# Patient Record
Sex: Male | Born: 1975 | Race: White | Hispanic: Yes
Health system: Southern US, Community
[De-identification: ages and names within clinical notes are randomized; demographics above are authoritative.]

---

## 2016-02-04 ENCOUNTER — Inpatient Hospital Stay (HOSPITAL_COMMUNITY)
Admission: EM | Admit: 2016-02-04 | Discharge: 2016-02-25 | DRG: 083 | Disposition: E | Payer: Worker's Compensation | Attending: General Surgery | Admitting: General Surgery

## 2016-02-04 ENCOUNTER — Emergency Department (HOSPITAL_COMMUNITY): Payer: Worker's Compensation

## 2016-02-04 ENCOUNTER — Inpatient Hospital Stay (HOSPITAL_COMMUNITY): Payer: Worker's Compensation

## 2016-02-04 DIAGNOSIS — R579 Shock, unspecified: Secondary | ICD-10-CM | POA: Diagnosis present

## 2016-02-04 DIAGNOSIS — R402312 Coma scale, best motor response, none, at arrival to emergency department: Secondary | ICD-10-CM | POA: Diagnosis present

## 2016-02-04 DIAGNOSIS — W11XXXA Fall on and from ladder, initial encounter: Secondary | ICD-10-CM | POA: Diagnosis present

## 2016-02-04 DIAGNOSIS — Y92512 Supermarket, store or market as the place of occurrence of the external cause: Secondary | ICD-10-CM

## 2016-02-04 DIAGNOSIS — I469 Cardiac arrest, cause unspecified: Secondary | ICD-10-CM | POA: Diagnosis present

## 2016-02-04 DIAGNOSIS — G9382 Brain death: Secondary | ICD-10-CM | POA: Diagnosis present

## 2016-02-04 DIAGNOSIS — W19XXXA Unspecified fall, initial encounter: Secondary | ICD-10-CM | POA: Diagnosis present

## 2016-02-04 DIAGNOSIS — R040 Epistaxis: Secondary | ICD-10-CM | POA: Diagnosis present

## 2016-02-04 DIAGNOSIS — R402212 Coma scale, best verbal response, none, at arrival to emergency department: Secondary | ICD-10-CM | POA: Diagnosis present

## 2016-02-04 DIAGNOSIS — S0219XA Other fracture of base of skull, initial encounter for closed fracture: Secondary | ICD-10-CM | POA: Diagnosis present

## 2016-02-04 DIAGNOSIS — S0232XA Fracture of orbital floor, left side, initial encounter for closed fracture: Secondary | ICD-10-CM | POA: Diagnosis present

## 2016-02-04 DIAGNOSIS — R402112 Coma scale, eyes open, never, at arrival to emergency department: Secondary | ICD-10-CM | POA: Diagnosis present

## 2016-02-04 DIAGNOSIS — S020XXA Fracture of vault of skull, initial encounter for closed fracture: Secondary | ICD-10-CM | POA: Diagnosis present

## 2016-02-04 DIAGNOSIS — R401 Stupor: Secondary | ICD-10-CM

## 2016-02-04 LAB — COMPREHENSIVE METABOLIC PANEL
ALBUMIN: 3.4 g/dL — AB (ref 3.5–5.0)
ALK PHOS: 114 U/L (ref 38–126)
ALT: 38 U/L (ref 17–63)
ANION GAP: 16 — AB (ref 5–15)
AST: 59 U/L — ABNORMAL HIGH (ref 15–41)
BUN: 11 mg/dL (ref 6–20)
CHLORIDE: 104 mmol/L (ref 101–111)
CO2: 19 mmol/L — AB (ref 22–32)
Calcium: 8.6 mg/dL — ABNORMAL LOW (ref 8.9–10.3)
Creatinine, Ser: 1.41 mg/dL — ABNORMAL HIGH (ref 0.61–1.24)
GFR calc non Af Amer: 29 mL/min — ABNORMAL LOW (ref >60)
GFR, EST AFRICAN AMERICAN: 34 mL/min — AB (ref >60)
GLUCOSE: 322 mg/dL — AB (ref 65–99)
Potassium: 3.4 mmol/L — ABNORMAL LOW (ref 3.5–5.1)
SODIUM: 139 mmol/L (ref 135–145)
Total Bilirubin: 0.6 mg/dL (ref 0.3–1.2)
Total Protein: 6.1 g/dL — ABNORMAL LOW (ref 6.5–8.1)

## 2016-02-04 LAB — I-STAT CHEM 8, ED
BUN: 12 mg/dL (ref 6–20)
CALCIUM ION: 1.09 mmol/L — AB (ref 1.13–1.30)
CHLORIDE: 102 mmol/L (ref 101–111)
Creatinine, Ser: 1.3 mg/dL — ABNORMAL HIGH (ref 0.61–1.24)
GLUCOSE: 309 mg/dL — AB (ref 65–99)
HCT: 35 % — ABNORMAL LOW (ref 39.0–52.0)
HEMOGLOBIN: 11.9 g/dL — AB (ref 13.0–17.0)
POTASSIUM: 3.4 mmol/L — AB (ref 3.5–5.1)
SODIUM: 141 mmol/L (ref 135–145)
TCO2: 20 mmol/L (ref 0–100)

## 2016-02-04 LAB — I-STAT ARTERIAL BLOOD GAS, ED
Acid-base deficit: 19 mmol/L — ABNORMAL HIGH (ref 0.0–2.0)
Bicarbonate: 12.9 mEq/L — ABNORMAL LOW (ref 20.0–24.0)
O2 SAT: 79 %
PCO2 ART: 62.3 mmHg — AB (ref 35.0–45.0)
PH ART: 6.924 — AB (ref 7.350–7.450)
PO2 ART: 71 mmHg — AB (ref 80.0–100.0)
Patient temperature: 98.6
TCO2: 15 mmol/L (ref 0–100)

## 2016-02-04 LAB — PREPARE RBC (CROSSMATCH)

## 2016-02-04 LAB — CBC
HEMATOCRIT: 36.9 % — AB (ref 39.0–52.0)
HEMOGLOBIN: 11.6 g/dL — AB (ref 13.0–17.0)
MCH: 27.2 pg (ref 26.0–34.0)
MCHC: 31.4 g/dL (ref 30.0–36.0)
MCV: 86.4 fL (ref 78.0–100.0)
Platelets: 247 10*3/uL (ref 150–400)
RBC: 4.27 MIL/uL (ref 4.22–5.81)
RDW: 12.7 % (ref 11.5–15.5)
WBC: 9.7 10*3/uL (ref 4.0–10.5)

## 2016-02-04 LAB — I-STAT CG4 LACTIC ACID, ED: Lactic Acid, Venous: 9.1 mmol/L (ref 0.5–1.9)

## 2016-02-04 LAB — ABO/RH: ABO/RH(D): O POS

## 2016-02-04 LAB — PROTIME-INR
INR: 1.25
Prothrombin Time: 15.8 seconds — ABNORMAL HIGH (ref 11.4–15.2)

## 2016-02-04 LAB — CDS SEROLOGY

## 2016-02-04 MED ORDER — SODIUM CHLORIDE 0.9 % IV SOLN
INTRAVENOUS | Status: AC | PRN
  Administered 2016-02-04: 2000 mL via INTRAVENOUS

## 2016-02-04 MED ORDER — ETOMIDATE 2 MG/ML IV SOLN
INTRAVENOUS | Status: AC | PRN
  Administered 2016-02-04: 10 mg via INTRAVENOUS

## 2016-02-04 MED ORDER — SODIUM CHLORIDE 0.9 % IV SOLN: 10.0000 mL/h | Freq: Once | INTRAVENOUS | Status: DC

## 2016-02-04 MED ORDER — EPINEPHRINE HCL 0.1 MG/ML IJ SOSY
PREFILLED_SYRINGE | INTRAMUSCULAR | Status: AC | PRN
  Administered 2016-02-04 (×5): 1 mg via INTRAVENOUS
  Administered 2016-02-04: 0.1 mg via INTRAVENOUS

## 2016-02-04 MED ORDER — NOREPINEPHRINE BITARTRATE 1 MG/ML IV SOLN
0.0000 ug/min | Freq: Once | INTRAVENOUS | Status: AC
  Administered 2016-02-04: 2 ug/min via INTRAVENOUS

## 2016-02-04 MED ORDER — ATROPINE SULFATE 1 MG/ML IJ SOLN
INTRAMUSCULAR | Status: AC | PRN
  Administered 2016-02-04: 1 mg via INTRAVENOUS

## 2016-02-04 MED ORDER — IOPAMIDOL (ISOVUE-300) INJECTION 61%
INTRAVENOUS | Status: AC
  Filled 2016-02-04: qty 100

## 2016-02-04 MED FILL — Medication: Qty: 1 | Status: AC

## 2016-02-05 LAB — PREPARE FRESH FROZEN PLASMA
UNIT DIVISION: 0
UNIT DIVISION: 0

## 2016-02-05 NOTE — Progress Notes (Signed)
   02/05/16 1706  Clinical Encounter Type  Visited With Family  Visit Type Initial;Death;ED  Referral From Nurse   Chaplain responded to a page to support the patient's family in the Emergency Dept. Lobby. Chaplain met with patient's brother and other family members. Family is trying to work out some details around getting the patient's body back to [place]. Social work might be a helpful resource here. Family initially asked about seeing the patient's body. Chaplain contacted [employer]or for approval (granted for mother and brothers). Before relaying that message, patient's brother indicated they do not want her mom to see the patient's body due to her health issues. Chaplain relayed a bit about the patient's condition, and that it may be a hard thing to view his body. Patient's brother indicated that he and his brother would probably not want to view the body. Spiritual care services available as needed.   Jeri Lager, Chaplain 02/05/16 5:10 PM

## 2016-02-08 LAB — TYPE AND SCREEN
ABO/RH(D): O POS
ANTIBODY SCREEN: NEGATIVE
UNIT DIVISION: 0
UNIT DIVISION: 0
UNIT DIVISION: 0
UNIT DIVISION: 0
UNIT DIVISION: 0
Unit division: 0
Unit division: 0
Unit division: 0
Unit division: 0
Unit division: 0

## 2016-02-09 MED ORDER — IOPAMIDOL (ISOVUE-300) INJECTION 61%
100.0000 mL | Freq: Once | INTRAVENOUS | Status: AC | PRN
  Administered 2016-02-04: 100 mL via INTRAVENOUS

## 2016-02-25 NOTE — Progress Notes (Signed)
Orthopedic Tech Progress Note Patient Details:  Luis West 06/26/1873 [id number] Level 1 trauma ortho visit. Patient ID: Luis West, male   DOB: 06/26/1873, [age] y.o.   MRN: [id number]   Jennye MoccasinHughes, Rubye Strohmeyer Craig Aug 25, 2015, 5:22 PM

## 2016-02-25 NOTE — ED Notes (Signed)
Per Dr. Dwain SarnaWakefield, to transfuse 2 units of blood

## 2016-02-25 NOTE — ED Notes (Signed)
Pt intubated at 1705, 7.5 at 22.

## 2016-02-25 NOTE — Code Documentation (Signed)
Pulses check. positive for pulses. Hr 141 ST. bp 92/53

## 2016-02-25 NOTE — ED Notes (Signed)
[employer] requesting to be notified if pt expires

## 2016-02-25 NOTE — Progress Notes (Signed)
Patient ID: Dorna BloomCristian Javier West, male   DOB: 03-Sep-1975, 40 y.o.   MRN: [id number] Patient coded for third time, no return of spontaneous circulation and with brain injury, pronounced dead at [id number]

## 2016-02-25 NOTE — ED Notes (Signed)
Oxygen saturation 77%. Respiratory at beside bagging pt at this time.

## 2016-02-25 NOTE — ED Provider Notes (Signed)
I saw and evaluated the patient, reviewed the resident's note and I agree with the findings and plan.  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  Per report from paramedics the patient was found in a large amount of blood hemorrhaging from his head, unresponsive, they were unable to intubate prehospital.   On exam the patient is obtunded, he does not respond to painful stimuli, his bilateral eyes show periorbital ecchymosis, dilated and fixed pupils, large palpable bony deformity to the right forehead.  He has weak pulses, is breathing spontaneously but insufficiently, required bag-valve-mask and eventually intubation  He also required multiple IV fluid boluses, blood transfusion, epinephrine for loss of pulses which lasted several minutes during which time CPR was initiated and a liter fed drip. Trauma surgery arrived and participated with resuscitative efforts. 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 Cervical collar was placed in the emergency department maintaining spinal precautions  Dr. Lindie SpruceWyatt placed Central Line in the R Femoral vein  I was personally present and directly supervised the following procedures:  Intubation    Cardiopulmonary Resuscitation (CPR) Procedure Note Directed/Performed by: Vida RollerBrian D Jonna Dittrich I personally directed ancillary staff and/or performed CPR in an effort to regain return of spontaneous circulation and to maintain cardiac, neuro and systemic perfusion.   CRITICAL CARE Performed by: Vida RollerBrian D D'Arcy Abraha Total critical care time: 75 minutes Critical care time was exclusive of separately billable procedures and treating other patients. Critical care was necessary to treat or prevent imminent or life-threatening  deterioration. Critical care was time spent personally by me on the following activities: development of treatment plan with patient and/or surrogate as well as nursing, discussions with consultants, evaluation of patient's response to treatment, examination of patient, obtaining history from patient or surrogate, ordering and performing treatments and interventions, ordering and review of laboratory studies, ordering and review of radiographic studies, pulse oximetry and re-evaluation of patient's condition.  The patient had multiple episodes of cardiac arrest in the emergency department, required ongoing intermittent CPR, epi dosing and Levophed titration.  with his devastating brain injury this is a nonsurvivable injury, it was finally determined by Dr. Dwain SarnaWakefield with general surgery that the patient was declared dead, please see his separate documentation   Final diagnoses:  None      Eber HongBrian Aidian Salomon, MD 02/05/16 1023

## 2016-02-25 NOTE — Consult Note (Signed)
Reason for Consult:facial trauma Referring Physician: er  Luis West is an 40 y.o. male.  HPI: patient with signifcant injury to the head after a fall off a ladder. He has Ct scan with massive skull fracture,facial fractures including orbit and both tables of frontal. Awaiting neurosurgery assessment of the brain.   No past medical history on file.  No past surgical history on file.  No family history on file.  Social History:  has no tobacco, alcohol, and drug history on file.  Allergies: Allergies not on file  Medications: I have reviewed the patient's current medications.  Results for orders placed or performed during the hospital encounter of 2016/02/18 (from the past 48 hour(s))  Prepare fresh frozen plasma     Status: None (Preliminary result)   Collection Time: February 18, 2016  4:56 PM  Result Value Ref Range   Unit Number [id number]    Blood Component Type LIQ PLASMA    Unit division 00    Status of Unit ISSUED    Unit tag comment VERBAL ORDERS PER DR MILLER    Transfusion Status OK TO TRANSFUSE    Unit Number [id number]    Blood Component Type LIQ PLASMA    Unit division 00    Status of Unit ISSUED    Unit tag comment VERBAL ORDERS PER DR MILLER    Transfusion Status OK TO TRANSFUSE   Type and screen     Status: None (Preliminary result)   Collection Time: 02-18-16  5:12 PM  Result Value Ref Range   ABO/RH(D) O POS    Antibody Screen NEG    Sample Expiration 02/07/2016    Unit Number [id number]    Blood Component Type RBC LR PHER2    Unit division 00    Status of Unit ISSUED    Unit tag comment VERBAL ORDERS PER DR MILLER    Transfusion Status OK TO TRANSFUSE    Crossmatch Result COMPATIBLE    Unit Number [id number]    Blood Component Type RED CELLS,LR    Unit division 00    Status of Unit ISSUED    Unit tag comment VERBAL ORDERS PER DR MILLER    Transfusion Status OK TO TRANSFUSE    Crossmatch Result COMPATIBLE    Unit Number  [id number]    Blood Component Type RED CELLS,LR    Unit division 00    Status of Unit ISSUED    Transfusion Status OK TO TRANSFUSE    Crossmatch Result Compatible    Unit Number [id number]    Blood Component Type RBC LR PHER2    Unit division 00    Status of Unit ISSUED    Transfusion Status OK TO TRANSFUSE    Crossmatch Result Compatible   Comprehensive metabolic panel     Status: Abnormal   Collection Time: 18-Feb-2016  5:12 PM  Result Value Ref Range   Sodium 139 135 - 145 mmol/L   Potassium 3.4 (L) 3.5 - 5.1 mmol/L   Chloride 104 101 - 111 mmol/L   CO2 19 (L) 22 - 32 mmol/L   Glucose, Bld 322 (H) 65 - 99 mg/dL   BUN 11 6 - 20 mg/dL   Creatinine, Ser 1.41 (H) 0.61 - 1.24 mg/dL   Calcium 8.6 (L) 8.9 - 10.3 mg/dL   Total Protein 6.1 (L) 6.5 - 8.1 g/dL   Albumin 3.4 (L) 3.5 - 5.0 g/dL   AST 59 (H) 15 - 41 U/L   ALT 38 17 - 63 U/L  Alkaline Phosphatase 114 38 - 126 U/L   Total Bilirubin 0.6 0.3 - 1.2 mg/dL   GFR calc non Af Amer 29 (L) >60 mL/min   GFR calc Af Amer 34 (L) >60 mL/min    Comment: (NOTE) The eGFR has been calculated using the CKD EPI equation. This calculation has not been validated in all clinical situations. eGFR's persistently <60 mL/min signify possible Chronic Kidney Disease.    Anion gap 16 (H) 5 - 15  CBC     Status: Abnormal   Collection Time: 02/26/2016  5:12 PM  Result Value Ref Range   WBC 9.7 4.0 - 10.5 K/uL   RBC 4.27 4.22 - 5.81 MIL/uL   Hemoglobin 11.6 (L) 13.0 - 17.0 g/dL   HCT 36.9 (L) 39.0 - 52.0 %   MCV 86.4 78.0 - 100.0 fL   MCH 27.2 26.0 - 34.0 pg   MCHC 31.4 30.0 - 36.0 g/dL   RDW 12.7 11.5 - 15.5 %   Platelets 247 150 - 400 K/uL  Protime-INR     Status: Abnormal   Collection Time: 26-Feb-2016  5:12 PM  Result Value Ref Range   Prothrombin Time 15.8 (H) 11.4 - 15.2 seconds   INR 1.25   ABO/Rh     Status: None (Preliminary result)   Collection Time: 02-26-16  5:12 PM  Result Value Ref Range   ABO/RH(D) O POS   I-Stat Chem 8,  ED     Status: Abnormal   Collection Time: 02-26-2016  5:22 PM  Result Value Ref Range   Sodium 141 135 - 145 mmol/L   Potassium 3.4 (L) 3.5 - 5.1 mmol/L   Chloride 102 101 - 111 mmol/L   BUN 12 6 - 20 mg/dL   Creatinine, Ser 1.30 (H) 0.61 - 1.24 mg/dL   Glucose, Bld 309 (H) 65 - 99 mg/dL   Calcium, Ion 1.09 (L) 1.13 - 1.30 mmol/L    Comment: QA FLAGS AND/OR RANGES MODIFIED BY DEMOGRAPHIC UPDATE ON 08/11 AT 1829   TCO2 20 0 - 100 mmol/L   Hemoglobin 11.9 (L) 13.0 - 17.0 g/dL   HCT 35.0 (L) 39.0 - 52.0 %  I-Stat CG4 Lactic Acid, ED     Status: Abnormal   Collection Time: 02/26/16  5:22 PM  Result Value Ref Range   Lactic Acid, Venous 9.10 (HH) 0.5 - 1.9 mmol/L   Comment NOTIFIED PHYSICIAN     Ct Head Wo Contrast  Result Date: 02-26-2016 CLINICAL DATA:  Level 1 trauma.  Display shelf fell on patient. EXAM: CT HEAD WITHOUT CONTRAST CT MAXILLOFACIAL WITHOUT CONTRAST CT CERVICAL SPINE WITHOUT CONTRAST TECHNIQUE: Multidetector CT imaging of the head, cervical spine, and maxillofacial structures were performed using the standard protocol without intravenous contrast. Multiplanar CT image reconstructions of the cervical spine and maxillofacial structures were also generated. COMPARISON:  None. FINDINGS: CT HEAD FINDINGS Diffuse subarachnoid hemorrhage. Extensive subarachnoid gas due to multiple fractures of the face. Ventricles are compressed and there may be diffuse cerebral edema right greater than left. There is mild intraventricular hemorrhage. Mild inter hemispheric subdural hematoma. Small subdural hematoma over the frontal convexity. Mild midline shift to the left. Severe facial fractures. See report below. Comminuted displaced frontal bone fractures bilaterally with depressed right frontal bone. Left frontal bone displaced outwardly. Extensive skullbase fracture extending into the foramen magnum and sphenoid sinus. CT MAXILLOFACIAL FINDINGS Severely comminuted fracture of the maxilla  bilaterally. Fracture of the maxillary sinus bilaterally. Fracture of the pterygoid plate bilaterally compatible with  LeFort type fracture. Fracture of the nasal septum. Fracture of the right orbit. The lateral orbit is displaced laterally and there is extensive hemorrhage within the right orbit. Fracture of the medial orbit, lateral orbit, an orbital roof. The globe is intact. Fracture of the left orbit involving the orbital floor, orbital roof, and medial orbit. Lateral orbit also fracture. There is moderate hematoma in the left orbit. Globe intact. Fracture of the skullbase extending through the sphenoid sinus down to the foramen magnum. There is also fracture through the region of the occipital mastoid synchondrosis. Fracture may extend to the right carotid canal. There is blood in the mastoid sinus bilaterally and in the right middle ear. Fracture across the planum sphenoidale extending into the left carotid canal. Comminuted displaced fracture the frontal bone bilaterally. Fracture through the inner and outer table of the frontal sinus bilaterally. Depressed fracture right frontal bone. Left frontal bone displaced outwardly. CT CERVICAL SPINE FINDINGS Normal cervical alignment.  Image quality degraded by motion. Negative for fracture. No significant degenerative change in the cervical spine. There is gas in the subarachnoid space in the cervical spine and also subarachnoid hemorrhage in the cervical spine related to intracranial hemorrhage. IMPRESSION: Severe head injury with comminuted displaced frontal bone fractures bilaterally. Diffuse subarachnoid hemorrhage in subarachnoid gas. Mild subdural hematoma in the interhemispheric fissure and over the convexity. Mild midline shift to the left may be related to cerebral edema on the right. Severely comminuted facial fractures and orbital fractures bilaterally. LeFort type injury. Extensive skullbase fracture which appears to extend into the carotid canal  bilaterally. Consider CTA to evaluate for arterial injury. Negative for cervical spine fracture. Electronically Signed   By: Franchot Gallo M.D.   On: Feb 12, 2016 18:27   Ct Chest W Contrast  Result Date: Feb 12, 2016 CLINICAL DATA:  Level 1 trauma. Fall 12-14 feet. Unresponsive. Uncontrolled bleeding. Right thigh puncture wound. EXAM: CT CHEST, ABDOMEN, AND PELVIS WITH CONTRAST TECHNIQUE: Multidetector CT imaging of the chest, abdomen and pelvis was performed following the standard protocol during bolus administration of intravenous contrast. CONTRAST:  100 cc Isovue-300 IV. COMPARISON:  None. FINDINGS: CT CHEST FINDINGS Mediastinum/Nodes: Normal heart size. No significant pericardial fluid/thickening. Great vessels are normal in course and caliber. No evidence of acute thoracic aortic injury. No pneumomediastinum. No anterior or middle mediastinal hematoma. No central pulmonary emboli. No discrete thyroid nodules. Unremarkable esophagus. No axillary, mediastinal or hilar lymphadenopathy. Lungs/Pleura: No pneumothorax. No pleural effusion. Endotracheal tube tip is 2.2 cm above the carina. There is extensive aspirated material in the lower trachea and mainstem bronchi. Calcified subcentimeter right upper lobe granuloma. There is extensive patchy consolidation and centrilobular ground-glass attenuation throughout both lungs, most prominent in the lower lobes and dependent right middle lobe. Musculoskeletal: No aggressive appearing focal osseous lesions. There is an oblique nondisplaced fracture through the left superior T6 endplate involving the anterior column with associated paraspinal hemorrhage. No additional fractures in the chest. Mild thoracic spondylosis. No thoracic spine subluxation. No appreciable thoracic canal hematoma. CT ABDOMEN PELVIS FINDINGS Motion degraded scan. Hepatobiliary: Normal liver with no liver laceration or mass. Normal gallbladder with no radiopaque cholelithiasis. No biliary ductal  dilatation. Pancreas: Normal, with no laceration, mass or duct dilation. Spleen: Normal size. No laceration or mass. Adrenals/Urinary Tract: Normal adrenals. No hydronephrosis. No renal laceration. No renal mass. Normal bladder. Stomach/Bowel: Grossly normal stomach. Normal caliber small bowel with no small bowel wall thickening. Normal appendix. Normal large bowel with no diverticulosis, large bowel wall thickening or pericolonic  fat stranding. Vascular/Lymphatic: Normal caliber abdominal aorta with no evidence of abdominal aortic injury. Right common femoral approach central venous catheter terminates in the right external iliac vein. Mild soft tissue gas is seen in the anterior right upper thigh from line placement. Patent portal, hepatic, splenic and renal veins. No pathologically enlarged lymph nodes in the abdomen or pelvis. Reproductive: Normal size prostate. Right testis is asymmetrically retracted into the right upper scrotum/right lower inguinal canal. Other: No pneumoperitoneum, ascites or focal fluid collection. Musculoskeletal: No aggressive appearing focal osseous lesions. No fracture in the abdomen or pelvis. Mild lumbar spondylosis. IMPRESSION: 1. Superior T6 vertebral endplate fracture involving the anterior column with associated paraspinal hemorrhage. No thoracic spine subluxation. No appreciable thoracic canal hematoma. 2. Extensive aspirated material in the central airways. Extensive patchy consolidation and centrilobular ground-glass attenuation throughout both lungs, likely representing aspiration and/or pulmonary contusion. Well-positioned endotracheal tube. 3. No acute traumatic injury in the abdomen or pelvis. Electronically Signed   By: Ilona Sorrel M.D.   On: 2016-02-21 18:21   Ct Cervical Spine Wo Contrast  Result Date: Feb 21, 2016 CLINICAL DATA:  Level 1 trauma.  Display shelf fell on patient. EXAM: CT HEAD WITHOUT CONTRAST CT MAXILLOFACIAL WITHOUT CONTRAST CT CERVICAL SPINE WITHOUT  CONTRAST TECHNIQUE: Multidetector CT imaging of the head, cervical spine, and maxillofacial structures were performed using the standard protocol without intravenous contrast. Multiplanar CT image reconstructions of the cervical spine and maxillofacial structures were also generated. COMPARISON:  None. FINDINGS: CT HEAD FINDINGS Diffuse subarachnoid hemorrhage. Extensive subarachnoid gas due to multiple fractures of the face. Ventricles are compressed and there may be diffuse cerebral edema right greater than left. There is mild intraventricular hemorrhage. Mild inter hemispheric subdural hematoma. Small subdural hematoma over the frontal convexity. Mild midline shift to the left. Severe facial fractures. See report below. Comminuted displaced frontal bone fractures bilaterally with depressed right frontal bone. Left frontal bone displaced outwardly. Extensive skullbase fracture extending into the foramen magnum and sphenoid sinus. CT MAXILLOFACIAL FINDINGS Severely comminuted fracture of the maxilla bilaterally. Fracture of the maxillary sinus bilaterally. Fracture of the pterygoid plate bilaterally compatible with LeFort type fracture. Fracture of the nasal septum. Fracture of the right orbit. The lateral orbit is displaced laterally and there is extensive hemorrhage within the right orbit. Fracture of the medial orbit, lateral orbit, an orbital roof. The globe is intact. Fracture of the left orbit involving the orbital floor, orbital roof, and medial orbit. Lateral orbit also fracture. There is moderate hematoma in the left orbit. Globe intact. Fracture of the skullbase extending through the sphenoid sinus down to the foramen magnum. There is also fracture through the region of the occipital mastoid synchondrosis. Fracture may extend to the right carotid canal. There is blood in the mastoid sinus bilaterally and in the right middle ear. Fracture across the planum sphenoidale extending into the left carotid canal.  Comminuted displaced fracture the frontal bone bilaterally. Fracture through the inner and outer table of the frontal sinus bilaterally. Depressed fracture right frontal bone. Left frontal bone displaced outwardly. CT CERVICAL SPINE FINDINGS Normal cervical alignment.  Image quality degraded by motion. Negative for fracture. No significant degenerative change in the cervical spine. There is gas in the subarachnoid space in the cervical spine and also subarachnoid hemorrhage in the cervical spine related to intracranial hemorrhage. IMPRESSION: Severe head injury with comminuted displaced frontal bone fractures bilaterally. Diffuse subarachnoid hemorrhage in subarachnoid gas. Mild subdural hematoma in the interhemispheric fissure and over the convexity. Mild midline  shift to the left may be related to cerebral edema on the right. Severely comminuted facial fractures and orbital fractures bilaterally. LeFort type injury. Extensive skullbase fracture which appears to extend into the carotid canal bilaterally. Consider CTA to evaluate for arterial injury. Negative for cervical spine fracture. Electronically Signed   By: Franchot Gallo M.D.   On: 02/05/16 18:27   Ct Abdomen Pelvis W Contrast  Result Date: 02/05/2016 CLINICAL DATA:  Level 1 trauma. Fall 12-14 feet. Unresponsive. Uncontrolled bleeding. Right thigh puncture wound. EXAM: CT CHEST, ABDOMEN, AND PELVIS WITH CONTRAST TECHNIQUE: Multidetector CT imaging of the chest, abdomen and pelvis was performed following the standard protocol during bolus administration of intravenous contrast. CONTRAST:  100 cc Isovue-300 IV. COMPARISON:  None. FINDINGS: CT CHEST FINDINGS Mediastinum/Nodes: Normal heart size. No significant pericardial fluid/thickening. Great vessels are normal in course and caliber. No evidence of acute thoracic aortic injury. No pneumomediastinum. No anterior or middle mediastinal hematoma. No central pulmonary emboli. No discrete thyroid nodules.  Unremarkable esophagus. No axillary, mediastinal or hilar lymphadenopathy. Lungs/Pleura: No pneumothorax. No pleural effusion. Endotracheal tube tip is 2.2 cm above the carina. There is extensive aspirated material in the lower trachea and mainstem bronchi. Calcified subcentimeter right upper lobe granuloma. There is extensive patchy consolidation and centrilobular ground-glass attenuation throughout both lungs, most prominent in the lower lobes and dependent right middle lobe. Musculoskeletal: No aggressive appearing focal osseous lesions. There is an oblique nondisplaced fracture through the left superior T6 endplate involving the anterior column with associated paraspinal hemorrhage. No additional fractures in the chest. Mild thoracic spondylosis. No thoracic spine subluxation. No appreciable thoracic canal hematoma. CT ABDOMEN PELVIS FINDINGS Motion degraded scan. Hepatobiliary: Normal liver with no liver laceration or mass. Normal gallbladder with no radiopaque cholelithiasis. No biliary ductal dilatation. Pancreas: Normal, with no laceration, mass or duct dilation. Spleen: Normal size. No laceration or mass. Adrenals/Urinary Tract: Normal adrenals. No hydronephrosis. No renal laceration. No renal mass. Normal bladder. Stomach/Bowel: Grossly normal stomach. Normal caliber small bowel with no small bowel wall thickening. Normal appendix. Normal large bowel with no diverticulosis, large bowel wall thickening or pericolonic fat stranding. Vascular/Lymphatic: Normal caliber abdominal aorta with no evidence of abdominal aortic injury. Right common femoral approach central venous catheter terminates in the right external iliac vein. Mild soft tissue gas is seen in the anterior right upper thigh from line placement. Patent portal, hepatic, splenic and renal veins. No pathologically enlarged lymph nodes in the abdomen or pelvis. Reproductive: Normal size prostate. Right testis is asymmetrically retracted into the right  upper scrotum/right lower inguinal canal. Other: No pneumoperitoneum, ascites or focal fluid collection. Musculoskeletal: No aggressive appearing focal osseous lesions. No fracture in the abdomen or pelvis. Mild lumbar spondylosis. IMPRESSION: 1. Superior T6 vertebral endplate fracture involving the anterior column with associated paraspinal hemorrhage. No thoracic spine subluxation. No appreciable thoracic canal hematoma. 2. Extensive aspirated material in the central airways. Extensive patchy consolidation and centrilobular ground-glass attenuation throughout both lungs, likely representing aspiration and/or pulmonary contusion. Well-positioned endotracheal tube. 3. No acute traumatic injury in the abdomen or pelvis. Electronically Signed   By: Ilona Sorrel M.D.   On: 02-05-2016 18:21   Dg Pelvis Portable  Result Date: 02-05-16 CLINICAL DATA:  Trauma patient Male of unknown age status post fall of 10 or more feet. Unresponsive. Initial encounter. EXAM: PORTABLE PELVIS 1-2 VIEWS COMPARISON:  Trauma series chest from today reported separately. FINDINGS: Portable AP view at 1721 hours. Dual lumen right femoral approach catheter in  place, catheter tip projects over the mid right sacrum. Bone mineralization is within normal limits. Femoral heads normally located. Pelvis and proximal femurs appear intact. SI joints within normal limits. Negative visible bowel gas pattern. IMPRESSION: 1.  No acute fracture or dislocation identified about the pelvis. 2. Dual lumen right femoral approach vascular catheter in place. Electronically Signed   By: Genevie Ann M.D.   On: 02-11-2016 17:47   Dg Chest Port 1 View  Result Date: 2016-02-11 CLINICAL DATA:  Trauma patient Male of unknown age status post fall of 10 or more feet. Unresponsive. Initial encounter. EXAM: PORTABLE CHEST 1 VIEW COMPARISON:  None. FINDINGS: Portable AP supine view at 1719 hours. Endotracheal tube tip in good position between the level the clavicles and  carina. Pacer or resuscitation pads project over the left chest and abdomen. Normal cardiac size and mediastinal contours. Somewhat low lung volumes. No pneumothorax or pleural effusion evident on this supine view. No pulmonary contusion identified. Questionable nondisplaced fractures of the right lateral fifth and eighth ribs. Other visible osseous structures appear grossly intact. Negative visible bowel gas pattern. IMPRESSION: 1. ET tube in good position. 2. Questionable nondisplaced right lateral fifth and eighth rib fractures. 3. No other acute traumatic injury identified radiographically. Electronically Signed   By: Genevie Ann M.D.   On: 02-11-2016 17:46   Ct Maxillofacial Wo Contrast  Result Date: 2016/02/11 CLINICAL DATA:  Level 1 trauma.  Display shelf fell on patient. EXAM: CT HEAD WITHOUT CONTRAST CT MAXILLOFACIAL WITHOUT CONTRAST CT CERVICAL SPINE WITHOUT CONTRAST TECHNIQUE: Multidetector CT imaging of the head, cervical spine, and maxillofacial structures were performed using the standard protocol without intravenous contrast. Multiplanar CT image reconstructions of the cervical spine and maxillofacial structures were also generated. COMPARISON:  None. FINDINGS: CT HEAD FINDINGS Diffuse subarachnoid hemorrhage. Extensive subarachnoid gas due to multiple fractures of the face. Ventricles are compressed and there may be diffuse cerebral edema right greater than left. There is mild intraventricular hemorrhage. Mild inter hemispheric subdural hematoma. Small subdural hematoma over the frontal convexity. Mild midline shift to the left. Severe facial fractures. See report below. Comminuted displaced frontal bone fractures bilaterally with depressed right frontal bone. Left frontal bone displaced outwardly. Extensive skullbase fracture extending into the foramen magnum and sphenoid sinus. CT MAXILLOFACIAL FINDINGS Severely comminuted fracture of the maxilla bilaterally. Fracture of the maxillary sinus  bilaterally. Fracture of the pterygoid plate bilaterally compatible with LeFort type fracture. Fracture of the nasal septum. Fracture of the right orbit. The lateral orbit is displaced laterally and there is extensive hemorrhage within the right orbit. Fracture of the medial orbit, lateral orbit, an orbital roof. The globe is intact. Fracture of the left orbit involving the orbital floor, orbital roof, and medial orbit. Lateral orbit also fracture. There is moderate hematoma in the left orbit. Globe intact. Fracture of the skullbase extending through the sphenoid sinus down to the foramen magnum. There is also fracture through the region of the occipital mastoid synchondrosis. Fracture may extend to the right carotid canal. There is blood in the mastoid sinus bilaterally and in the right middle ear. Fracture across the planum sphenoidale extending into the left carotid canal. Comminuted displaced fracture the frontal bone bilaterally. Fracture through the inner and outer table of the frontal sinus bilaterally. Depressed fracture right frontal bone. Left frontal bone displaced outwardly. CT CERVICAL SPINE FINDINGS Normal cervical alignment.  Image quality degraded by motion. Negative for fracture. No significant degenerative change in the cervical spine. There is gas  in the subarachnoid space in the cervical spine and also subarachnoid hemorrhage in the cervical spine related to intracranial hemorrhage. IMPRESSION: Severe head injury with comminuted displaced frontal bone fractures bilaterally. Diffuse subarachnoid hemorrhage in subarachnoid gas. Mild subdural hematoma in the interhemispheric fissure and over the convexity. Mild midline shift to the left may be related to cerebral edema on the right. Severely comminuted facial fractures and orbital fractures bilaterally. LeFort type injury. Extensive skullbase fracture which appears to extend into the carotid canal bilaterally. Consider CTA to evaluate for arterial  injury. Negative for cervical spine fracture. Electronically Signed   By: Franchot Gallo M.D.   On: 02-25-16 18:27    ROS Blood pressure (!) 80/54, pulse 110, temperature 98.5 F (36.9 C), temperature source Axillary, resp. rate 26, height 5' 6"  (1.676 m), weight 113.4 kg (250 lb), SpO2 100 %. Physical Exam  HENT:  Patient is intubated and significant facial swelling. Blood per nose ETT tube in the mouth. Cannot access tongue or mouth. C-collar in place    Assessment/Plan: Massive head and facial fracture- he will be admitted to the ICU and neurosurgery to assess the treatment of the brain and determine when facial fracture can be fixed.   Melissa Montane 2016-02-25, 6:32 PM

## 2016-02-25 NOTE — Consult Note (Signed)
**  Note** Pt is unresponsive, unable to provide history. No family available. History obtained from EMR and trauma/ED team.  CC:  Fall  HPI: Luis West is a 40 y.o. male brought in by EMS after a witnessed fall while working on a platform somewhere between 12 and 220ft high. The ladder apparently collapsed and he fell essentially landing on his face. He was unresponsive and was intubated in the ED.  PMH: No past medical history on file.  Unknown  PSH: No past surgical history on file.  Unknown  SH: Social History  Substance Use Topics  . Smoking status: Not on file  . Smokeless tobacco: Not on file  . Alcohol use Not on file    MEDS: Prior to Admission medications   Not on File    ALLERGY: Allergies not on file  ROS: ROS  NEUROLOGIC EXAM: No eye opening to pain Pupils 5-496mm, non-reactive No cough/gag Not breathing over vent No motor responses to central pain  IMGAING: CT head reviewed which demonstrates multiple severe facial fractures. There are bilateral frontal fractures, comminuted and depressed on the right and through the orbital roof. There is diffuse bilateral cerebral edema with effacement of the lateral ventricles. There is bilateral uncal herniation. There is tonsillar herniation through the foramen magnum. Diffuse pneumocephalus.  IMPRESSION: - 40 y.o. male s/p fall with neurologic exam c/w brain death. Given his CT findings and current condition, I think this is a non-survivable injury.  PLAN: - Would not escalate care or proceed with ACLS code as there is no meaningful chance of recovery

## 2016-02-25 NOTE — Progress Notes (Signed)
Pt transported to and from ED Trauma C to CT 2 on ventilator with no complications. RT will continue to monitor.

## 2016-02-25 NOTE — Progress Notes (Signed)
Critical abg results hand delivered to Dr Dwain SarnaWakefield. Increased RR to 20 per order.

## 2016-02-25 NOTE — Code Documentation (Signed)
Patient time of death occurred at 441945.

## 2016-02-25 NOTE — ED Notes (Signed)
Pt returned from CT with this nurse. No adverse events. VSS

## 2016-02-25 NOTE — ED Notes (Signed)
Per EMS - pt lost balance, fell backwards off 12-3314ft display shelf [facility] Unresponsive upon EMS arrival. Copious blood at scene. Blood in airway, unable to intubate pt. ST 110bpm. No IV access, no BP obtained. Obvious injuries to head/neck. Uncontrolled bleeding. Puncture wound from drill in right upper thigh

## 2016-02-25 NOTE — H&P (Signed)
Luis West is an 40 y.o. male.   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  Larey Seat and is brought in. He apparently moved all extremities at some point, during er stay he is in shock, briefly lost pulse and received cpr.  No fluid on fast. Received prbcs/ffp, intubated. All of other history is unknown apparently he fell on his drill to right thigh  No past medical history on file.  No past surgical history on file.  No family history on file. Social History:  has no tobacco, alcohol, and drug history on file.  Allergies: Allergies not on file   Results for orders placed or performed during the hospital encounter of 02/11/2016 (from the past 48 hour(s))  Prepare fresh frozen plasma     Status: None (Preliminary result)   Collection Time: 11-Feb-2016  4:56 PM  Result Value Ref Range   Unit Number [id number]    Blood Component Type LIQ PLASMA    Unit division 00    Status of Unit ISSUED    Unit tag comment VERBAL ORDERS PER DR MILLER    Transfusion Status OK TO TRANSFUSE    Unit Number [id number]    Blood Component Type LIQ PLASMA    Unit division 00    Status of Unit ISSUED    Unit tag comment VERBAL ORDERS PER DR MILLER    Transfusion Status OK TO TRANSFUSE   Type and screen     Status: None (Preliminary result)   Collection Time: 02/11/2016  5:12 PM  Result Value Ref Range   ABO/RH(D) O POS    Antibody Screen NEG    Sample Expiration 02/07/2016    Unit Number [id number]    Blood Component Type RBC LR PHER2    Unit division 00    Status of Unit ISSUED    Unit tag comment VERBAL ORDERS PER DR MILLER    Transfusion Status OK TO TRANSFUSE    Crossmatch Result PENDING    Unit Number [id number]    Blood Component Type RED CELLS,LR    Unit division 00    Status of Unit ISSUED    Unit tag comment VERBAL ORDERS PER DR MILLER    Transfusion Status OK TO TRANSFUSE    Crossmatch  Result PENDING   CBC     Status: Abnormal   Collection Time: Feb 11, 2016  5:12 PM  Result Value Ref Range   WBC 9.7 4.0 - 10.5 K/uL   RBC 4.27 4.22 - 5.81 MIL/uL   Hemoglobin 11.6 (L) 13.0 - 17.0 g/dL   HCT 47.4 (L) 25.9 - 56.3 %   MCV 86.4 78.0 - 100.0 fL   MCH 27.2 26.0 - 34.0 pg   MCHC 31.4 30.0 - 36.0 g/dL   RDW 87.5 64.3 - 32.9 %   Platelets 247 150 - 400 K/uL  Protime-INR     Status: Abnormal   Collection Time: 02/11/2016  5:12 PM  Result Value Ref Range   Prothrombin Time 15.8 (H) 11.4 - 15.2 seconds   INR 1.25   I-Stat Chem 8, ED     Status: Abnormal   Collection Time: 02-11-2016  5:22 PM  Result Value Ref Range   Sodium 141 135 - 145 mmol/L   Potassium 3.4 (L) 3.5 - 5.1 mmol/L   Chloride 102 101 - 111 mmol/L   BUN 12 6 - 20 mg/dL   Creatinine, Ser 5.18 (H)  0.61 - 1.24 mg/dL   Glucose, Bld 454309 (H) 65 - 99 mg/dL   Calcium, Ion 0.981.09 (L) 1.12 - 1.23 mmol/L   TCO2 20 0 - 100 mmol/L   Hemoglobin 11.9 (L) 13.0 - 17.0 g/dL   HCT 11.935.0 (L) 14.739.0 - 82.952.0 %  I-Stat CG4 Lactic Acid, ED     Status: Abnormal   Collection Time: 03/26/2016  5:22 PM  Result Value Ref Range   Lactic Acid, Venous 9.10 (HH) 0.5 - 1.9 mmol/L   Comment NOTIFIED PHYSICIAN    Dg Pelvis Portable  Result Date: 02/15/16 CLINICAL DATA:  Trauma patient Male of unknown age status post fall of 10 or more feet. Unresponsive. Initial encounter. EXAM: PORTABLE PELVIS 1-2 VIEWS COMPARISON:  Trauma series chest from today reported separately. FINDINGS: Portable AP view at 1721 hours. Dual lumen right femoral approach catheter in place, catheter tip projects over the mid right sacrum. Bone mineralization is within normal limits. Femoral heads normally located. Pelvis and proximal femurs appear intact. SI joints within normal limits. Negative visible bowel gas pattern. IMPRESSION: 1.  No acute fracture or dislocation identified about the pelvis. 2. Dual lumen right femoral approach vascular catheter in place. Electronically Signed    By: Odessa FlemingH  Hall M.D.   On: 008/22/17 17:47   Dg Chest Port 1 View  Result Date: 02/15/16 CLINICAL DATA:  Trauma patient Male of unknown age status post fall of 10 or more feet. Unresponsive. Initial encounter. EXAM: PORTABLE CHEST 1 VIEW COMPARISON:  None. FINDINGS: Portable AP supine view at 1719 hours. Endotracheal tube tip in good position between the level the clavicles and carina. Pacer or resuscitation pads project over the left chest and abdomen. Normal cardiac size and mediastinal contours. Somewhat low lung volumes. No pneumothorax or pleural effusion evident on this supine view. No pulmonary contusion identified. Questionable nondisplaced fractures of the right lateral fifth and eighth ribs. Other visible osseous structures appear grossly intact. Negative visible bowel gas pattern. IMPRESSION: 1. ET tube in good position. 2. Questionable nondisplaced right lateral fifth and eighth rib fractures. 3. No other acute traumatic injury identified radiographically. Electronically Signed   By: Odessa FlemingH  Hall M.D.   On: 008/22/17 17:46    Review of Systems  Unable to perform ROS: Intubated    Blood pressure 155/81, pulse 83, resp. rate 18, height 5\' 6"  (1.676 m), weight 113.4 kg (250 lb), SpO2 100 %. Physical Exam  Vitals reviewed. Constitutional: He appears well-developed.  HENT:  Head: Head is with abrasion and with contusion.  Right Ear: External ear normal.  Left Ear: External ear normal.  Nose: Epistaxis is observed.  Depressed frontal skull fx on exam Copious hemorrhage from his oral cavity and bilateral nares  Eyes: Right pupil is not reactive. Left pupil is not reactive.  Pupils dilated  Neck:  c collar in place   Cardiovascular: Regular rhythm and intact distal pulses.  Tachycardia present.   Respiratory:  Intubated with bilateral coarse breath sounds   GI: Soft.  Genitourinary: Penis normal.  Musculoskeletal: He exhibits no edema.  Neurological: He is unresponsive. GCS eye  subscore is 1. GCS verbal subscore is 1. GCS motor subscore is 1.     Assessment/Plan Fall from ladder  1. Neuro- intubated when I saw him, appears to have devastating head injury with significant facial fx/bleeding, consult to neurosurgery and ent consult  2. Cv/pulm- cont mech ventilation, no real blood loss except from face I think may be component of neurogenic shock and  is on levophed right now  I have discussed situation with his brother Lars Mage and have told him I dont think this is likely a survivable injury. He is on his way from [place]ain Childes, MD 03/04/16, 5:58 PM

## 2016-02-25 NOTE — Code Documentation (Signed)
Pt became bradycardic and then lost pulses.  CPR started

## 2016-02-25 NOTE — ED Notes (Signed)
Belongings: 1 Pair Jeans(Cut), 1 Pair Underwear(Cut), 1 Pair Boots, 1 Belt, 1 Money [place], 1 [address], $16.10$81.29 in [place] Currency, 1 [employer]. All belongings remain with patient with exception of patient's money and credit card which was conveyed to brother [place].

## 2016-02-25 NOTE — ED Notes (Signed)
Pt transported to CT. Accompanied by Dr. Dwain SarnaWakefield, Resp Therapist, Inetta Fermoina (RN).

## 2016-02-25 NOTE — ED Notes (Signed)
Carotid pulse palpated by Dr. Lindie SpruceWyatt

## 2016-02-25 NOTE — ED Provider Notes (Signed)
[facility]-EMERGENCY DEPT Provider Note   CSN: [id number] Arrival date & time: 02/15/2016  1703  First Provider Contact:  None       History   Chief Complaint No chief complaint on file.   HPI Luis West is a 40 y.o. male.  HPI level V caveat acuity of condition  Patient presents by EMS for fall. 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  Occurred just pta. EMS found him GCS 3, with extensive amount of blood around him, bleeding from his airway. They attempted intubation were unsuccessful.   No past medical history on file.  There are no active problems to display for this patient.   No past surgical history on file.     Home Medications    Prior to Admission medications   Not on File    Family History No family history on file.  Social History Social History  Substance Use Topics  . Smoking status: Not on file  . Smokeless tobacco: Not on file  . Alcohol use Not on file     Allergies   Review of patient's allergies indicates not on file.   Review of Systems Review of Systems  Unable to perform ROS: Patient unresponsive     Physical Exam Updated Vital Signs Pulse 97   Resp (!) 36   SpO2 100%   Physical Exam  Constitutional: He appears distressed. He is intubated.  HENT:  Depressed frontal skull fracture.  Bleeding from R ear.  L ear TM intact without blood.  Bleeding from Oropharynx and nares  Severe bilateral orbital echymoses  Eyes:  Fixed and dilated.  Neck:  Immobilized on arrival  Cardiovascular: Tachycardia present.   tachycardia  Pulmonary/Chest: He is intubated.  Requiring assisted ventillation  Abdominal: Soft. Normal appearance. He exhibits no distension.  Genitourinary: Penis normal.  Musculoskeletal: Normal range of motion. He exhibits no deformity.  Neurological: He is unresponsive. GCS  eye subscore is 1. GCS verbal subscore is 1. GCS motor subscore is 1.  Obtunded, GCS 3  Skin:  Other than bruising over the eyes, and a puncture in the R proximal anterior thigh, there is no other obvious skin wounds / lesions     ED Treatments / Results  Labs (all labs ordered are listed, but only abnormal results are displayed) Labs Reviewed  I-STAT CHEM 8, ED - Abnormal; Notable for the following:       Result Value   Potassium 3.4 (*)    Creatinine, Ser 1.30 (*)    Glucose, Bld 309 (*)    Calcium, Ion 1.09 (*)    Hemoglobin 11.9 (*)    HCT 35.0 (*)    All other components within normal limits  CDS SEROLOGY  COMPREHENSIVE METABOLIC PANEL  CBC  ETHANOL  URINALYSIS, ROUTINE W REFLEX MICROSCOPIC (NOT AT [facility])  PROTIME-INR  I-STAT CG4 LACTIC ACID, ED  TYPE AND SCREEN  PREPARE FRESH FROZEN PLASMA  SAMPLE TO BLOOD BANK    EKG  EKG Interpretation None       Radiology No results found.  Procedures .Intubation Date/Time: 2016/02/15 6:51 PM Performed by: Marcelina Morel Authorized by: Marcelina Morel   Consent:    Consent obtained:  Emergent situation Pre-procedure details:    Patient status:  Unresponsive   Mallampati score:  II   Pretreatment medications:  None   Paralytics:  None  Procedure details:    Preoxygenation:  Bag valve mask   CPR in progress: no     Intubation method:  Oral   Oral intubation technique:  Video-assisted   Laryngoscope size: glidescope.   Tube size (mm):  7.5   Tube type:  Cuffed   Number of attempts:  1 Placement assessment:    ETT to teeth:  22   Tube secured with:  ETT holder   Breath sounds:  Equal   Placement verification: chest rise, CXR verification, equal breath sounds and ETCO2 detector     CXR findings:  ETT in proper place   (including critical care time)  Medications Ordered in ED Medications  iopamidol (ISOVUE-300) 61 % injection (not administered)     Initial Impression / Assessment and Plan / ED Course    I have reviewed the triage vital signs and the nursing notes.  Pertinent labs & imaging results that were available during my care of the patient were reviewed by me and considered in my medical decision making (see chart for details).  Clinical Course   Upon arrival patient was noted of GCS 3, blood in the oropharynx, so intubation was performed as above secured airway. After intubation, patient about her breath sounds. We were unable to obtain a blood pressure, but patient did have intact carotid and femoral pulses. Central line was placed by trauma.  Physical exam significant for depressed skull fracture, bleeding from the right eardrum, very concerning for head bleed. Patient remained unresponsive.  Patient briefly lost pulses, received ACLS, with Rosc.  Chest x-ray performed showed no hemo-, pneumo-thorax, and ET tube in good position. Pelvis x-ray showed no signs of fracture.  CT scans most concerning for comminuted skull fractures, LeFort fracture, pterygoid fracture, subarachnoid hemorrhage. Negative CT C-spine  Patient again lost pulses, received ACLS, achieved ROSC again.  7:53 PM Called to room at 1920 for hypotension.  Increased pressors and ordered blood.  Patient's BP continued to decline, and he lose pulses.  ACLS was performed for over 10 minutes.  Dr. Dwain SarnaWakefield, Trauma, was present at bedside.  Patient continued to be pulseless.  Given his extensive head injury, it was felt that this was non-survivable.  ACLS was stopped.  Final Clinical Impressions(s) / ED Diagnoses   Final diagnoses:  Closed fracture of anterior fossa of skull, initial encounter (HCC)  Obtunded  Cardiac arrest [facility](HCC)    New Prescriptions New Prescriptions   No medications on file     Marcelina MorelMichael Supples, MD June 02, 2016 1954    Eber HongBrian Delphina Schum, MD 02/05/16 1022

## 2016-02-25 NOTE — Procedures (Signed)
Intubation Procedure Note [place] [id number] 06/26/1873  Procedure: Intubation Indications: Respiratory insufficiency  Procedure Details Consent: Unable to obtain consent because of emergent medical necessity. Time Out: Verified patient identification, verified procedure, site/side was marked, verified correct patient position, special equipment/implants available, medications/allergies/relevent history reviewed, required imaging and test results available.  Performed  Maximum sterile technique was used including cap, gloves, gown, hand hygiene and mask.  MAC and 3    Evaluation Hemodynamic Status: BP stable throughout; O2 sats: stable throughout Patient's Current Condition: stable Complications: No apparent complications Patient did tolerate procedure well. Chest X-ray ordered to verify placement.  CXR: pending.  Pt intubated following Level 1 Trauma. Pt intubated using glidescope #3 blade with 7.5 ett secured at 22 at the lip. Pt with copious amounts of blood and blood clots coming from mouth and nose. Per Trauma MD, gauze inserted into mouth to help absorb blood. Pt with bilateral coarse BS throughout. Positive color change noted on etco2, direct visualization, and etco2 of 38 on monitor. CXR pending. RT will continue to monitor.   Luis ShiverKelley, Luis West Luis West September 09, 2015

## 2016-02-25 NOTE — Progress Notes (Signed)
RT NOTE:  RT called for CPR on patient, RT manually bagged. Pt pronounced dead @ 1945. ETT remains in place for ME.

## 2016-02-25 NOTE — Progress Notes (Signed)
Subjective: Patient was bleeding significantly from the nose and needed packing.  Objective: Vital signs in last 24 hours: Temp:  [98.5 F (36.9 C)] 98.5 F (36.9 C) (08/11 1708) Pulse Rate:  [65-144] 144 (08/11 1840) Resp:  [15-59] 18 (08/11 1830) BP: (48-192)/(34-168) 92/53 (08/11 1840) SpO2:  [88 %-100 %] 89 % (08/11 1840) FiO2 (%):  [100 %] 100 % (08/11 1705) Weight:  [113.4 kg (250 lb)] 113.4 kg (250 lb) (08/11 1733)    Intake/Output from previous day: No intake/output data recorded. Intake/Output this shift: Total I/O In: 3348 [I.V.:2000; Blood:674; Other:674] Out: -   the left nares has laceration. blood coming from mouth and nose bilaterally. Merocel sponges with bacitracin placed in bioth sides and strings tied. Examine his eyes with neurosurgery and both were fixed and dilated. required 2 to open the swollen lids.   Lab Results:   Recent Labs  05-Feb-2016 1712 02/05/2016 1722  WBC 9.7  --   HGB 11.6* 11.9*  HCT 36.9* 35.0*  PLT 247  --    BMET  Recent Labs  05-Feb-2016 1712 02-05-2016 1722  NA 139 141  K 3.4* 3.4*  CL 104 102  CO2 19*  --   GLUCOSE 322* 309*  BUN 11 12  CREATININE 1.41* 1.30*  CALCIUM 8.6*  --    PT/INR  Recent Labs  02/05/2016 1712  LABPROT 15.8*  INR 1.25   ABG No results for input(s): PHART, HCO3 in the last 72 hours.  Invalid input(s): PCO2, PO2  Studies/Results: Ct Head Wo Contrast  Result Date: 02/05/16 CLINICAL DATA:  Level 1 trauma.  Display shelf fell on patient. EXAM: CT HEAD WITHOUT CONTRAST CT MAXILLOFACIAL WITHOUT CONTRAST CT CERVICAL SPINE WITHOUT CONTRAST TECHNIQUE: Multidetector CT imaging of the head, cervical spine, and maxillofacial structures were performed using the standard protocol without intravenous contrast. Multiplanar CT image reconstructions of the cervical spine and maxillofacial structures were also generated. COMPARISON:  None. FINDINGS: CT HEAD FINDINGS Diffuse subarachnoid hemorrhage. Extensive  subarachnoid gas due to multiple fractures of the face. Ventricles are compressed and there may be diffuse cerebral edema right greater than left. There is mild intraventricular hemorrhage. Mild inter hemispheric subdural hematoma. Small subdural hematoma over the frontal convexity. Mild midline shift to the left. Severe facial fractures. See report below. Comminuted displaced frontal bone fractures bilaterally with depressed right frontal bone. Left frontal bone displaced outwardly. Extensive skullbase fracture extending into the foramen magnum and sphenoid sinus. CT MAXILLOFACIAL FINDINGS Severely comminuted fracture of the maxilla bilaterally. Fracture of the maxillary sinus bilaterally. Fracture of the pterygoid plate bilaterally compatible with LeFort type fracture. Fracture of the nasal septum. Fracture of the right orbit. The lateral orbit is displaced laterally and there is extensive hemorrhage within the right orbit. Fracture of the medial orbit, lateral orbit, an orbital roof. The globe is intact. Fracture of the left orbit involving the orbital floor, orbital roof, and medial orbit. Lateral orbit also fracture. There is moderate hematoma in the left orbit. Globe intact. Fracture of the skullbase extending through the sphenoid sinus down to the foramen magnum. There is also fracture through the region of the occipital mastoid synchondrosis. Fracture may extend to the right carotid canal. There is blood in the mastoid sinus bilaterally and in the right middle ear. Fracture across the planum sphenoidale extending into the left carotid canal. Comminuted displaced fracture the frontal bone bilaterally. Fracture through the inner and outer table of the frontal sinus bilaterally. Depressed fracture right frontal bone. Left frontal  bone displaced outwardly. CT CERVICAL SPINE FINDINGS Normal cervical alignment.  Image quality degraded by motion. Negative for fracture. No significant degenerative change in the  cervical spine. There is gas in the subarachnoid space in the cervical spine and also subarachnoid hemorrhage in the cervical spine related to intracranial hemorrhage. IMPRESSION: Severe head injury with comminuted displaced frontal bone fractures bilaterally. Diffuse subarachnoid hemorrhage in subarachnoid gas. Mild subdural hematoma in the interhemispheric fissure and over the convexity. Mild midline shift to the left may be related to cerebral edema on the right. Severely comminuted facial fractures and orbital fractures bilaterally. LeFort type injury. Extensive skullbase fracture which appears to extend into the carotid canal bilaterally. Consider CTA to evaluate for arterial injury. Negative for cervical spine fracture. Electronically Signed   By: Marlan Palau M.D.   On: 02/25/2016 18:27   Ct Chest W Contrast  Result Date: 2016-02-25 CLINICAL DATA:  Level 1 trauma. Fall 12-14 feet. Unresponsive. Uncontrolled bleeding. Right thigh puncture wound. EXAM: CT CHEST, ABDOMEN, AND PELVIS WITH CONTRAST TECHNIQUE: Multidetector CT imaging of the chest, abdomen and pelvis was performed following the standard protocol during bolus administration of intravenous contrast. CONTRAST:  100 cc Isovue-300 IV. COMPARISON:  None. FINDINGS: CT CHEST FINDINGS Mediastinum/Nodes: Normal heart size. No significant pericardial fluid/thickening. Great vessels are normal in course and caliber. No evidence of acute thoracic aortic injury. No pneumomediastinum. No anterior or middle mediastinal hematoma. No central pulmonary emboli. No discrete thyroid nodules. Unremarkable esophagus. No axillary, mediastinal or hilar lymphadenopathy. Lungs/Pleura: No pneumothorax. No pleural effusion. Endotracheal tube tip is 2.2 cm above the carina. There is extensive aspirated material in the lower trachea and mainstem bronchi. Calcified subcentimeter right upper lobe granuloma. There is extensive patchy consolidation and centrilobular  ground-glass attenuation throughout both lungs, most prominent in the lower lobes and dependent right middle lobe. Musculoskeletal: No aggressive appearing focal osseous lesions. There is an oblique nondisplaced fracture through the left superior T6 endplate involving the anterior column with associated paraspinal hemorrhage. No additional fractures in the chest. Mild thoracic spondylosis. No thoracic spine subluxation. No appreciable thoracic canal hematoma. CT ABDOMEN PELVIS FINDINGS Motion degraded scan. Hepatobiliary: Normal liver with no liver laceration or mass. Normal gallbladder with no radiopaque cholelithiasis. No biliary ductal dilatation. Pancreas: Normal, with no laceration, mass or duct dilation. Spleen: Normal size. No laceration or mass. Adrenals/Urinary Tract: Normal adrenals. No hydronephrosis. No renal laceration. No renal mass. Normal bladder. Stomach/Bowel: Grossly normal stomach. Normal caliber small bowel with no small bowel wall thickening. Normal appendix. Normal large bowel with no diverticulosis, large bowel wall thickening or pericolonic fat stranding. Vascular/Lymphatic: Normal caliber abdominal aorta with no evidence of abdominal aortic injury. Right common femoral approach central venous catheter terminates in the right external iliac vein. Mild soft tissue gas is seen in the anterior right upper thigh from line placement. Patent portal, hepatic, splenic and renal veins. No pathologically enlarged lymph nodes in the abdomen or pelvis. Reproductive: Normal size prostate. Right testis is asymmetrically retracted into the right upper scrotum/right lower inguinal canal. Other: No pneumoperitoneum, ascites or focal fluid collection. Musculoskeletal: No aggressive appearing focal osseous lesions. No fracture in the abdomen or pelvis. Mild lumbar spondylosis. IMPRESSION: 1. Superior T6 vertebral endplate fracture involving the anterior column with associated paraspinal hemorrhage. No thoracic  spine subluxation. No appreciable thoracic canal hematoma. 2. Extensive aspirated material in the central airways. Extensive patchy consolidation and centrilobular ground-glass attenuation throughout both lungs, likely representing aspiration and/or pulmonary contusion. Well-positioned endotracheal tube. 3.  No acute traumatic injury in the abdomen or pelvis. Electronically Signed   By: Delbert PhenixJason A Poff M.D.   On: 07/12/15 18:21   Ct Cervical Spine Wo Contrast  Result Date: 04-21-16 CLINICAL DATA:  Level 1 trauma.  Display shelf fell on patient. EXAM: CT HEAD WITHOUT CONTRAST CT MAXILLOFACIAL WITHOUT CONTRAST CT CERVICAL SPINE WITHOUT CONTRAST TECHNIQUE: Multidetector CT imaging of the head, cervical spine, and maxillofacial structures were performed using the standard protocol without intravenous contrast. Multiplanar CT image reconstructions of the cervical spine and maxillofacial structures were also generated. COMPARISON:  None. FINDINGS: CT HEAD FINDINGS Diffuse subarachnoid hemorrhage. Extensive subarachnoid gas due to multiple fractures of the face. Ventricles are compressed and there may be diffuse cerebral edema right greater than left. There is mild intraventricular hemorrhage. Mild inter hemispheric subdural hematoma. Small subdural hematoma over the frontal convexity. Mild midline shift to the left. Severe facial fractures. See report below. Comminuted displaced frontal bone fractures bilaterally with depressed right frontal bone. Left frontal bone displaced outwardly. Extensive skullbase fracture extending into the foramen magnum and sphenoid sinus. CT MAXILLOFACIAL FINDINGS Severely comminuted fracture of the maxilla bilaterally. Fracture of the maxillary sinus bilaterally. Fracture of the pterygoid plate bilaterally compatible with LeFort type fracture. Fracture of the nasal septum. Fracture of the right orbit. The lateral orbit is displaced laterally and there is extensive hemorrhage within the  right orbit. Fracture of the medial orbit, lateral orbit, an orbital roof. The globe is intact. Fracture of the left orbit involving the orbital floor, orbital roof, and medial orbit. Lateral orbit also fracture. There is moderate hematoma in the left orbit. Globe intact. Fracture of the skullbase extending through the sphenoid sinus down to the foramen magnum. There is also fracture through the region of the occipital mastoid synchondrosis. Fracture may extend to the right carotid canal. There is blood in the mastoid sinus bilaterally and in the right middle ear. Fracture across the planum sphenoidale extending into the left carotid canal. Comminuted displaced fracture the frontal bone bilaterally. Fracture through the inner and outer table of the frontal sinus bilaterally. Depressed fracture right frontal bone. Left frontal bone displaced outwardly. CT CERVICAL SPINE FINDINGS Normal cervical alignment.  Image quality degraded by motion. Negative for fracture. No significant degenerative change in the cervical spine. There is gas in the subarachnoid space in the cervical spine and also subarachnoid hemorrhage in the cervical spine related to intracranial hemorrhage. IMPRESSION: Severe head injury with comminuted displaced frontal bone fractures bilaterally. Diffuse subarachnoid hemorrhage in subarachnoid gas. Mild subdural hematoma in the interhemispheric fissure and over the convexity. Mild midline shift to the left may be related to cerebral edema on the right. Severely comminuted facial fractures and orbital fractures bilaterally. LeFort type injury. Extensive skullbase fracture which appears to extend into the carotid canal bilaterally. Consider CTA to evaluate for arterial injury. Negative for cervical spine fracture. Electronically Signed   By: Marlan Palauharles  Clark M.D.   On: 07/12/15 18:27   Ct Abdomen Pelvis W Contrast  Result Date: 04-21-16 CLINICAL DATA:  Level 1 trauma. Fall 12-14 feet. Unresponsive.  Uncontrolled bleeding. Right thigh puncture wound. EXAM: CT CHEST, ABDOMEN, AND PELVIS WITH CONTRAST TECHNIQUE: Multidetector CT imaging of the chest, abdomen and pelvis was performed following the standard protocol during bolus administration of intravenous contrast. CONTRAST:  100 cc Isovue-300 IV. COMPARISON:  None. FINDINGS: CT CHEST FINDINGS Mediastinum/Nodes: Normal heart size. No significant pericardial fluid/thickening. Great vessels are normal in course and caliber. No evidence of acute thoracic aortic  injury. No pneumomediastinum. No anterior or middle mediastinal hematoma. No central pulmonary emboli. No discrete thyroid nodules. Unremarkable esophagus. No axillary, mediastinal or hilar lymphadenopathy. Lungs/Pleura: No pneumothorax. No pleural effusion. Endotracheal tube tip is 2.2 cm above the carina. There is extensive aspirated material in the lower trachea and mainstem bronchi. Calcified subcentimeter right upper lobe granuloma. There is extensive patchy consolidation and centrilobular ground-glass attenuation throughout both lungs, most prominent in the lower lobes and dependent right middle lobe. Musculoskeletal: No aggressive appearing focal osseous lesions. There is an oblique nondisplaced fracture through the left superior T6 endplate involving the anterior column with associated paraspinal hemorrhage. No additional fractures in the chest. Mild thoracic spondylosis. No thoracic spine subluxation. No appreciable thoracic canal hematoma. CT ABDOMEN PELVIS FINDINGS Motion degraded scan. Hepatobiliary: Normal liver with no liver laceration or mass. Normal gallbladder with no radiopaque cholelithiasis. No biliary ductal dilatation. Pancreas: Normal, with no laceration, mass or duct dilation. Spleen: Normal size. No laceration or mass. Adrenals/Urinary Tract: Normal adrenals. No hydronephrosis. No renal laceration. No renal mass. Normal bladder. Stomach/Bowel: Grossly normal stomach. Normal caliber  small bowel with no small bowel wall thickening. Normal appendix. Normal large bowel with no diverticulosis, large bowel wall thickening or pericolonic fat stranding. Vascular/Lymphatic: Normal caliber abdominal aorta with no evidence of abdominal aortic injury. Right common femoral approach central venous catheter terminates in the right external iliac vein. Mild soft tissue gas is seen in the anterior right upper thigh from line placement. Patent portal, hepatic, splenic and renal veins. No pathologically enlarged lymph nodes in the abdomen or pelvis. Reproductive: Normal size prostate. Right testis is asymmetrically retracted into the right upper scrotum/right lower inguinal canal. Other: No pneumoperitoneum, ascites or focal fluid collection. Musculoskeletal: No aggressive appearing focal osseous lesions. No fracture in the abdomen or pelvis. Mild lumbar spondylosis. IMPRESSION: 1. Superior T6 vertebral endplate fracture involving the anterior column with associated paraspinal hemorrhage. No thoracic spine subluxation. No appreciable thoracic canal hematoma. 2. Extensive aspirated material in the central airways. Extensive patchy consolidation and centrilobular ground-glass attenuation throughout both lungs, likely representing aspiration and/or pulmonary contusion. Well-positioned endotracheal tube. 3. No acute traumatic injury in the abdomen or pelvis. Electronically Signed   By: Delbert Phenix M.D.   On: 02/28/16 18:21   Dg Pelvis Portable  Result Date: 02/28/2016 CLINICAL DATA:  Trauma patient Male of unknown age status post fall of 10 or more feet. Unresponsive. Initial encounter. EXAM: PORTABLE PELVIS 1-2 VIEWS COMPARISON:  Trauma series chest from today reported separately. FINDINGS: Portable AP view at 1721 hours. Dual lumen right femoral approach catheter in place, catheter tip projects over the mid right sacrum. Bone mineralization is within normal limits. Femoral heads normally located. Pelvis and  proximal femurs appear intact. SI joints within normal limits. Negative visible bowel gas pattern. IMPRESSION: 1.  No acute fracture or dislocation identified about the pelvis. 2. Dual lumen right femoral approach vascular catheter in place. Electronically Signed   By: Odessa Fleming M.D.   On: 02-28-16 17:47   Dg Chest Port 1 View  Result Date: 2016-02-28 CLINICAL DATA:  Trauma patient Male of unknown age status post fall of 10 or more feet. Unresponsive. Initial encounter. EXAM: PORTABLE CHEST 1 VIEW COMPARISON:  None. FINDINGS: Portable AP supine view at 1719 hours. Endotracheal tube tip in good position between the level the clavicles and carina. Pacer or resuscitation pads project over the left chest and abdomen. Normal cardiac size and mediastinal contours. Somewhat low lung volumes. No pneumothorax  or pleural effusion evident on this supine view. No pulmonary contusion identified. Questionable nondisplaced fractures of the right lateral fifth and eighth ribs. Other visible osseous structures appear grossly intact. Negative visible bowel gas pattern. IMPRESSION: 1. ET tube in good position. 2. Questionable nondisplaced right lateral fifth and eighth rib fractures. 3. No other acute traumatic injury identified radiographically. Electronically Signed   By: Odessa Fleming M.D.   On: 02/22/2016 17:46   Ct Maxillofacial Wo Contrast  Result Date: 02-22-16 CLINICAL DATA:  Level 1 trauma.  Display shelf fell on patient. EXAM: CT HEAD WITHOUT CONTRAST CT MAXILLOFACIAL WITHOUT CONTRAST CT CERVICAL SPINE WITHOUT CONTRAST TECHNIQUE: Multidetector CT imaging of the head, cervical spine, and maxillofacial structures were performed using the standard protocol without intravenous contrast. Multiplanar CT image reconstructions of the cervical spine and maxillofacial structures were also generated. COMPARISON:  None. FINDINGS: CT HEAD FINDINGS Diffuse subarachnoid hemorrhage. Extensive subarachnoid gas due to multiple fractures  of the face. Ventricles are compressed and there may be diffuse cerebral edema right greater than left. There is mild intraventricular hemorrhage. Mild inter hemispheric subdural hematoma. Small subdural hematoma over the frontal convexity. Mild midline shift to the left. Severe facial fractures. See report below. Comminuted displaced frontal bone fractures bilaterally with depressed right frontal bone. Left frontal bone displaced outwardly. Extensive skullbase fracture extending into the foramen magnum and sphenoid sinus. CT MAXILLOFACIAL FINDINGS Severely comminuted fracture of the maxilla bilaterally. Fracture of the maxillary sinus bilaterally. Fracture of the pterygoid plate bilaterally compatible with LeFort type fracture. Fracture of the nasal septum. Fracture of the right orbit. The lateral orbit is displaced laterally and there is extensive hemorrhage within the right orbit. Fracture of the medial orbit, lateral orbit, an orbital roof. The globe is intact. Fracture of the left orbit involving the orbital floor, orbital roof, and medial orbit. Lateral orbit also fracture. There is moderate hematoma in the left orbit. Globe intact. Fracture of the skullbase extending through the sphenoid sinus down to the foramen magnum. There is also fracture through the region of the occipital mastoid synchondrosis. Fracture may extend to the right carotid canal. There is blood in the mastoid sinus bilaterally and in the right middle ear. Fracture across the planum sphenoidale extending into the left carotid canal. Comminuted displaced fracture the frontal bone bilaterally. Fracture through the inner and outer table of the frontal sinus bilaterally. Depressed fracture right frontal bone. Left frontal bone displaced outwardly. CT CERVICAL SPINE FINDINGS Normal cervical alignment.  Image quality degraded by motion. Negative for fracture. No significant degenerative change in the cervical spine. There is gas in the  subarachnoid space in the cervical spine and also subarachnoid hemorrhage in the cervical spine related to intracranial hemorrhage. IMPRESSION: Severe head injury with comminuted displaced frontal bone fractures bilaterally. Diffuse subarachnoid hemorrhage in subarachnoid gas. Mild subdural hematoma in the interhemispheric fissure and over the convexity. Mild midline shift to the left may be related to cerebral edema on the right. Severely comminuted facial fractures and orbital fractures bilaterally. LeFort type injury. Extensive skullbase fracture which appears to extend into the carotid canal bilaterally. Consider CTA to evaluate for arterial injury. Negative for cervical spine fracture. Electronically Signed   By: Marlan Palau M.D.   On: 02/22/16 18:27    Anti-infectives: Anti-infectives    None      Assessment/Plan: s/p * No surgery found * epistaxis- packed bilaterally with merocel.   LOS: 0 days    Suzanna Obey February 22, 2016

## 2016-02-25 DEATH — deceased

## 2018-02-03 IMAGING — CT CT CERVICAL SPINE W/O CM
2 of 11 series · 6 of 33 positions shown, 7 images · non-contrast
Comparison: None.

CLINICAL DATA: Level 1 trauma.  Display shelf fell on patient.

EXAM:
CT HEAD WITHOUT CONTRAST
CT MAXILLOFACIAL WITHOUT CONTRAST
CT CERVICAL SPINE WITHOUT CONTRAST
TECHNIQUE: Multidetector CT imaging of the head, cervical spine, and
maxillofacial structures were performed using the standard protocol
without intravenous contrast. Multiplanar CT image reconstructions
of the cervical spine and maxillofacial structures were also
generated.

[Series 8: c_spine 2.0 st · axial · 0.24mm/px · z∈[-234,-164]mm · 2 of 105 slices shown, 3 images]
[im 35/105  soft-tissue]
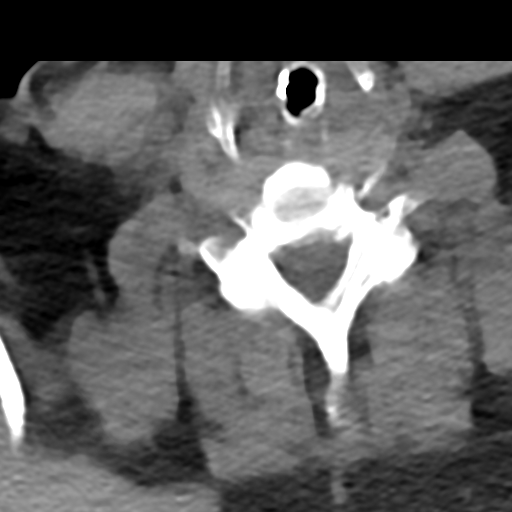
[im 35/105  bone]
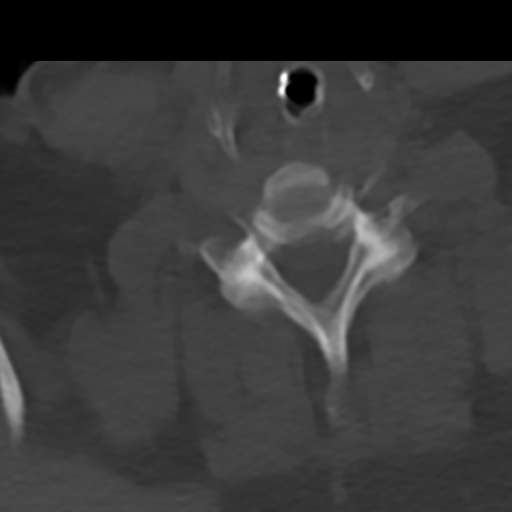
[im 70/105  bone]
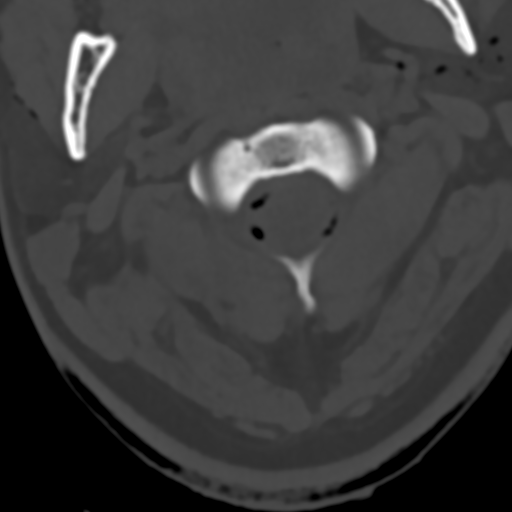

[Series 18: facialbone 2.0 sag st · sagittal · 0.32mm/px · 4 of 91 slices shown]
[im 19/91  bone]
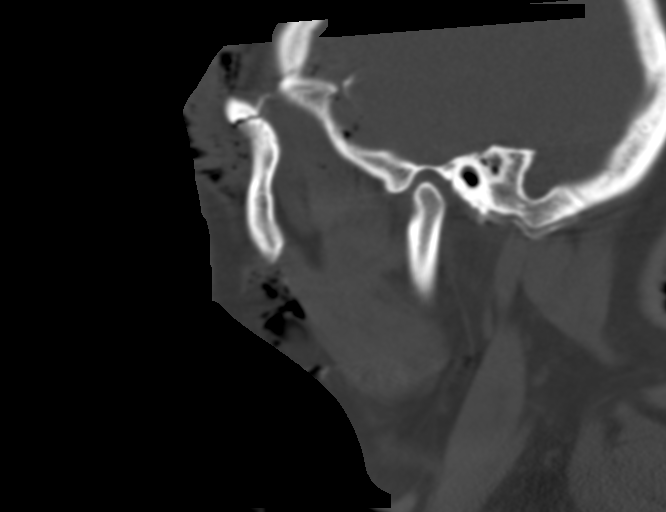
[im 37/91  bone]
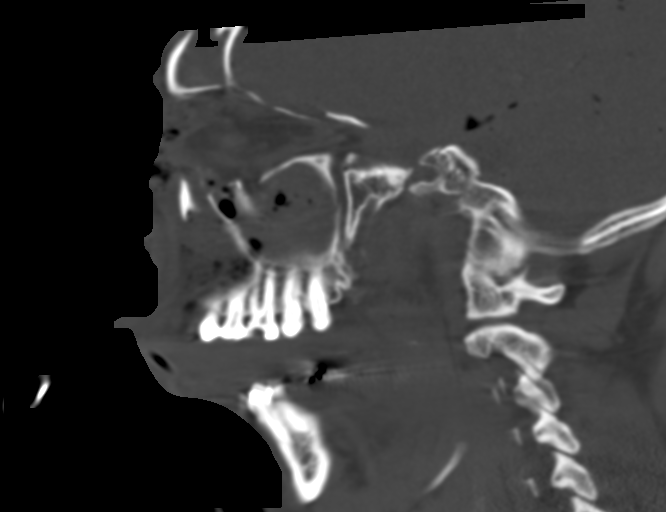
[im 55/91  bone]
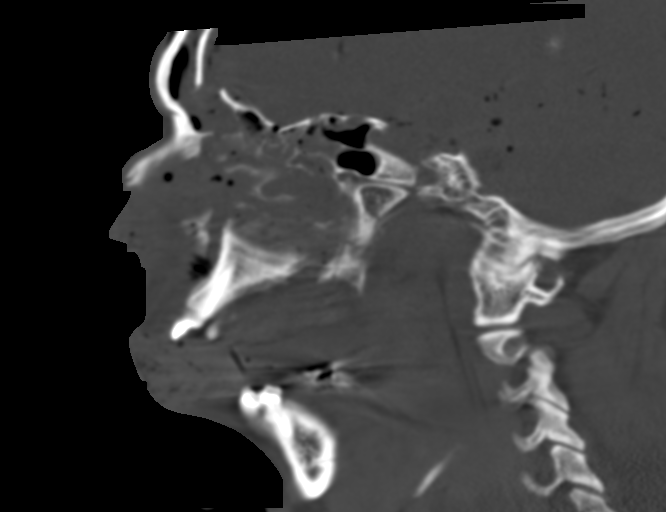
[im 73/91  bone]
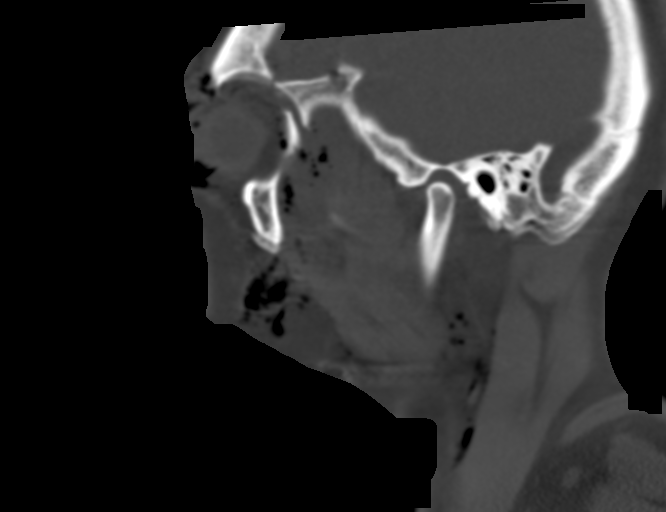

[6 of 33 positions shown; findings below may reference images not displayed]

FINDINGS: CT HEAD FINDINGS

Diffuse subarachnoid hemorrhage. Extensive subarachnoid gas due to
multiple fractures of the face. Ventricles are compressed and there
may be diffuse cerebral edema right greater than left. There is mild
intraventricular hemorrhage. Mild inter hemispheric subdural
hematoma. Small subdural hematoma over the frontal convexity. Mild
midline shift to the left.

Severe facial fractures. See report below. Comminuted displaced
frontal bone fractures bilaterally with depressed right frontal
bone. Left frontal bone displaced outwardly. Extensive skullbase
fracture extending into the foramen magnum and sphenoid sinus.

CT MAXILLOFACIAL FINDINGS

Severely comminuted fracture of the maxilla bilaterally. Fracture of
the maxillary sinus bilaterally. Fracture of the pterygoid plate
bilaterally compatible with LeFort type fracture. Fracture of the
nasal septum.

Fracture of the right orbit. The lateral orbit is displaced
laterally and there is extensive hemorrhage within the right orbit.
Fracture of the medial orbit, lateral orbit, an orbital roof. The
globe is intact.

Fracture of the left orbit involving the orbital floor, orbital
roof, and medial orbit. Lateral orbit also fracture. There is
moderate hematoma in the left orbit. Globe intact.

Fracture of the skullbase extending through the sphenoid sinus down
to the foramen magnum. There is also fracture through the region of
the occipital mastoid synchondrosis. Fracture may extend to the
right carotid canal. There is blood in the mastoid sinus bilaterally
and in the right middle ear. Fracture across the planum sphenoidale
extending into the left carotid canal.

Comminuted displaced fracture the frontal bone bilaterally. Fracture
through the inner and outer table of the frontal sinus bilaterally.
Depressed fracture right frontal bone. Left frontal bone displaced
outwardly.

CT CERVICAL SPINE FINDINGS

Normal cervical alignment.  Image quality degraded by motion.

Negative for fracture. No significant degenerative change in the
cervical spine. There is gas in the subarachnoid space in the
cervical spine and also subarachnoid hemorrhage in the cervical
spine related to intracranial hemorrhage.
IMPRESSION: Severe head injury with comminuted displaced frontal bone fractures
bilaterally. Diffuse subarachnoid hemorrhage in subarachnoid gas.
Mild subdural hematoma in the interhemispheric fissure and over the
convexity. Mild midline shift to the left may be related to cerebral
edema on the right.

Severely comminuted facial fractures and orbital fractures
bilaterally. LeFort type injury.

Extensive skullbase fracture which appears to extend into the
carotid canal bilaterally. Consider CTA to evaluate for arterial
injury.

Negative for cervical spine fracture.
